# Patient Record
Sex: Female | Born: 1940 | Race: White | Hispanic: No | Marital: Married | Smoking: Never smoker
Health system: Southern US, Community
[De-identification: ages and names within clinical notes are randomized; demographics above are authoritative.]

## PROBLEM LIST (undated history)

## (undated) DIAGNOSIS — E7800 Pure hypercholesterolemia, unspecified: Secondary | ICD-10-CM

## (undated) DIAGNOSIS — C801 Malignant (primary) neoplasm, unspecified: Secondary | ICD-10-CM

## (undated) DIAGNOSIS — M810 Age-related osteoporosis without current pathological fracture: Secondary | ICD-10-CM

## (undated) DIAGNOSIS — H409 Unspecified glaucoma: Secondary | ICD-10-CM

---
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
## 1967-11-17 HISTORY — PX: TUMOR REMOVAL: SHX12

## 1977-11-16 HISTORY — PX: ABDOMINAL HYSTERECTOMY: SHX81

## 2002-11-16 HISTORY — PX: MOHS SURGERY: SUR867

## 2004-09-25 ENCOUNTER — Ambulatory Visit: Payer: Self-pay | Admitting: Internal Medicine

## 2004-11-16 HISTORY — PX: BASAL CELL CARCINOMA EXCISION: SHX1214

## 2005-05-14 ENCOUNTER — Encounter: Admission: RE | Admit: 2005-05-14 | Discharge: 2005-05-14 | Payer: Self-pay | Admitting: Internal Medicine

## 2005-05-26 ENCOUNTER — Encounter: Admission: RE | Admit: 2005-05-26 | Discharge: 2005-05-26 | Payer: Self-pay | Admitting: Internal Medicine

## 2006-05-12 ENCOUNTER — Encounter: Admission: RE | Admit: 2006-05-12 | Discharge: 2006-05-12 | Payer: Self-pay | Admitting: Internal Medicine

## 2007-06-01 ENCOUNTER — Encounter: Admission: RE | Admit: 2007-06-01 | Discharge: 2007-06-01 | Payer: Self-pay | Admitting: Obstetrics and Gynecology

## 2007-10-25 ENCOUNTER — Ambulatory Visit: Payer: Self-pay | Admitting: Internal Medicine

## 2007-11-01 ENCOUNTER — Ambulatory Visit: Payer: Self-pay | Admitting: Internal Medicine

## 2007-11-01 ENCOUNTER — Encounter: Payer: Self-pay | Admitting: Internal Medicine

## 2008-06-15 ENCOUNTER — Encounter: Admission: RE | Admit: 2008-06-15 | Discharge: 2008-06-15 | Payer: Self-pay | Admitting: Obstetrics and Gynecology

## 2008-06-27 ENCOUNTER — Encounter: Admission: RE | Admit: 2008-06-27 | Discharge: 2008-06-27 | Payer: Self-pay | Admitting: Obstetrics and Gynecology

## 2009-06-28 ENCOUNTER — Encounter: Admission: RE | Admit: 2009-06-28 | Discharge: 2009-06-28 | Payer: Self-pay | Admitting: Obstetrics and Gynecology

## 2010-06-30 ENCOUNTER — Encounter: Admission: RE | Admit: 2010-06-30 | Discharge: 2010-06-30 | Payer: Self-pay | Admitting: Internal Medicine

## 2010-11-16 HISTORY — PX: EYE SURGERY: SHX253

## 2010-12-07 ENCOUNTER — Encounter: Payer: Self-pay | Admitting: Internal Medicine

## 2010-12-07 ENCOUNTER — Encounter: Payer: Self-pay | Admitting: Pulmonary Disease

## 2010-12-08 ENCOUNTER — Encounter: Payer: Self-pay | Admitting: Obstetrics and Gynecology

## 2011-06-08 ENCOUNTER — Other Ambulatory Visit: Payer: Self-pay | Admitting: Obstetrics and Gynecology

## 2011-06-08 DIAGNOSIS — Z1231 Encounter for screening mammogram for malignant neoplasm of breast: Secondary | ICD-10-CM

## 2011-07-02 ENCOUNTER — Ambulatory Visit
Admission: RE | Admit: 2011-07-02 | Discharge: 2011-07-02 | Disposition: A | Discharge status: Home / Self Care | Payer: Medicare Other | Source: Ambulatory Visit | Attending: Obstetrics and Gynecology | Admitting: Obstetrics and Gynecology

## 2011-07-02 DIAGNOSIS — Z1231 Encounter for screening mammogram for malignant neoplasm of breast: Secondary | ICD-10-CM

## 2012-05-26 ENCOUNTER — Other Ambulatory Visit: Payer: Self-pay | Admitting: Internal Medicine

## 2012-05-26 DIAGNOSIS — Z1231 Encounter for screening mammogram for malignant neoplasm of breast: Secondary | ICD-10-CM

## 2012-07-04 ENCOUNTER — Ambulatory Visit
Admission: RE | Admit: 2012-07-04 | Discharge: 2012-07-04 | Disposition: A | Discharge status: Home / Self Care | Payer: Medicare Other | Source: Ambulatory Visit | Attending: Internal Medicine | Admitting: Internal Medicine

## 2012-07-04 DIAGNOSIS — Z1231 Encounter for screening mammogram for malignant neoplasm of breast: Secondary | ICD-10-CM

## 2013-05-31 ENCOUNTER — Other Ambulatory Visit: Payer: Self-pay

## 2013-05-31 DIAGNOSIS — Z1231 Encounter for screening mammogram for malignant neoplasm of breast: Secondary | ICD-10-CM

## 2013-07-05 ENCOUNTER — Ambulatory Visit
Admission: RE | Admit: 2013-07-05 | Discharge: 2013-07-05 | Disposition: A | Discharge status: Home / Self Care | Payer: Medicare Other | Source: Ambulatory Visit

## 2013-07-05 DIAGNOSIS — Z1231 Encounter for screening mammogram for malignant neoplasm of breast: Secondary | ICD-10-CM

## 2014-06-01 ENCOUNTER — Other Ambulatory Visit: Payer: Self-pay

## 2014-06-01 DIAGNOSIS — Z1231 Encounter for screening mammogram for malignant neoplasm of breast: Secondary | ICD-10-CM

## 2014-07-06 ENCOUNTER — Ambulatory Visit
Admission: RE | Admit: 2014-07-06 | Discharge: 2014-07-06 | Disposition: A | Discharge status: Home / Self Care | Payer: Medicare Other | Source: Ambulatory Visit

## 2014-07-06 DIAGNOSIS — Z1231 Encounter for screening mammogram for malignant neoplasm of breast: Secondary | ICD-10-CM

## 2014-12-18 ENCOUNTER — Encounter: Payer: Self-pay | Admitting: Internal Medicine

## 2015-01-07 ENCOUNTER — Encounter: Payer: Self-pay | Admitting: Internal Medicine

## 2015-03-15 ENCOUNTER — Encounter (HOSPITAL_COMMUNITY): Payer: Self-pay

## 2015-03-18 ENCOUNTER — Encounter (HOSPITAL_COMMUNITY): Payer: Self-pay

## 2015-03-18 ENCOUNTER — Ambulatory Visit (HOSPITAL_COMMUNITY)
Admission: RE | Admit: 2015-03-18 | Discharge: 2015-03-18 | Disposition: A | Discharge status: Home / Self Care | Payer: Medicare Other | Source: Ambulatory Visit | Attending: Internal Medicine | Admitting: Internal Medicine

## 2015-03-18 ENCOUNTER — Other Ambulatory Visit (HOSPITAL_COMMUNITY): Payer: Self-pay | Admitting: Internal Medicine

## 2015-03-18 DIAGNOSIS — M810 Age-related osteoporosis without current pathological fracture: Secondary | ICD-10-CM | POA: Insufficient documentation

## 2015-03-18 HISTORY — DX: Unspecified glaucoma: H40.9

## 2015-03-18 HISTORY — DX: Malignant (primary) neoplasm, unspecified: C80.1

## 2015-03-18 MED ORDER — DENOSUMAB 60 MG/ML ~~LOC~~ SOLN
60.0000 mg | Freq: Once | SUBCUTANEOUS | Status: AC
  Administered 2015-03-18: 60 mg via SUBCUTANEOUS
  Filled 2015-03-18: qty 1

## 2015-03-18 NOTE — Discharge Instructions (Signed)
Denosumab injection What is this medicine? DENOSUMAB (den oh sue mab) slows bone breakdown. Prolia is used to treat osteoporosis in women after menopause and in men. Xgeva is used to prevent bone fractures and other bone problems caused by cancer bone metastases. Xgeva is also used to treat giant cell tumor of the bone. This medicine may be used for other purposes; ask your health care provider or pharmacist if you have questions. COMMON BRAND NAME(S): Prolia, XGEVA What should I tell my health care provider before I take this medicine? They need to know if you have any of these conditions: -dental disease -eczema -infection or history of infections -kidney disease or on dialysis -low blood calcium or vitamin D -malabsorption syndrome -scheduled to have surgery or tooth extraction -taking medicine that contains denosumab -thyroid or parathyroid disease -an unusual reaction to denosumab, other medicines, foods, dyes, or preservatives -pregnant or trying to get pregnant -breast-feeding How should I use this medicine? This medicine is for injection under the skin. It is given by a health care professional in a hospital or clinic setting. If you are getting Prolia, a special MedGuide will be given to you by the pharmacist with each prescription and refill. Be sure to read this information carefully each time. For Prolia, talk to your pediatrician regarding the use of this medicine in children. Special care may be needed. For Xgeva, talk to your pediatrician regarding the use of this medicine in children. While this drug may be prescribed for children as young as 13 years for selected conditions, precautions do apply. Overdosage: If you think you've taken too much of this medicine contact a poison control center or emergency room at once. Overdosage: If you think you have taken too much of this medicine contact a poison control center or emergency room at once. NOTE: This medicine is only for  you. Do not share this medicine with others. What if I miss a dose? It is important not to miss your dose. Call your doctor or health care professional if you are unable to keep an appointment. What may interact with this medicine? Do not take this medicine with any of the following medications: -other medicines containing denosumab This medicine may also interact with the following medications: -medicines that suppress the immune system -medicines that treat cancer -steroid medicines like prednisone or cortisone This list may not describe all possible interactions. Give your health care provider a list of all the medicines, herbs, non-prescription drugs, or dietary supplements you use. Also tell them if you smoke, drink alcohol, or use illegal drugs. Some items may interact with your medicine. What should I watch for while using this medicine? Visit your doctor or health care professional for regular checks on your progress. Your doctor or health care professional may order blood tests and other tests to see how you are doing. Call your doctor or health care professional if you get a cold or other infection while receiving this medicine. Do not treat yourself. This medicine may decrease your body's ability to fight infection. You should make sure you get enough calcium and vitamin D while you are taking this medicine, unless your doctor tells you not to. Discuss the foods you eat and the vitamins you take with your health care professional. See your dentist regularly. Brush and floss your teeth as directed. Before you have any dental work done, tell your dentist you are receiving this medicine. Do not become pregnant while taking this medicine or for 5 months after stopping   it. Women should inform their doctor if they wish to become pregnant or think they might be pregnant. There is a potential for serious side effects to an unborn child. Talk to your health care professional or pharmacist for more  information. What side effects may I notice from receiving this medicine? Side effects that you should report to your doctor or health care professional as soon as possible: -allergic reactions like skin rash, itching or hives, swelling of the face, lips, or tongue -breathing problems -chest pain -fast, irregular heartbeat -feeling faint or lightheaded, falls -fever, chills, or any other sign of infection -muscle spasms, tightening, or twitches -numbness or tingling -skin blisters or bumps, or is dry, peels, or red -slow healing or unexplained pain in the mouth or jaw -unusual bleeding or bruising Side effects that usually do not require medical attention (Report these to your doctor or health care professional if they continue or are bothersome.): -muscle pain -stomach upset, gas This list may not describe all possible side effects. Call your doctor for medical advice about side effects. You may report side effects to FDA at 1-800-FDA-1088. Where should I keep my medicine? This medicine is only given in a clinic, doctor's office, or other health care setting and will not be stored at home. NOTE: This sheet is a summary. It may not cover all possible information. If you have questions about this medicine, talk to your doctor, pharmacist, or health care provider.  2015, Elsevier/Gold Standard. (2012-05-02 12:37:47)  

## 2015-03-20 ENCOUNTER — Ambulatory Visit (AMBULATORY_SURGERY_CENTER): Payer: Self-pay

## 2015-03-20 VITALS — Ht 64.0 in | Wt 126.8 lb

## 2015-03-20 DIAGNOSIS — Z860100 Personal history of colon polyps, unspecified: Secondary | ICD-10-CM

## 2015-03-20 MED ORDER — MOVIPREP 100 G PO SOLR: 1.0000 | Freq: Once | ORAL | Status: DC

## 2015-03-20 NOTE — Progress Notes (Signed)
No allergies to eggs or soy No diet/weight loss meds No home oxygen No past problems with anesthesia  Has email  Emmi instructions given for colonoscopy 

## 2015-04-03 ENCOUNTER — Other Ambulatory Visit: Payer: Self-pay | Admitting: Internal Medicine

## 2015-04-03 ENCOUNTER — Encounter: Payer: Self-pay | Admitting: Internal Medicine

## 2015-04-03 ENCOUNTER — Ambulatory Visit (AMBULATORY_SURGERY_CENTER): Payer: Medicare Other | Admitting: Internal Medicine

## 2015-04-03 VITALS — BP 110/64 | HR 48 | Temp 95.1°F | Resp 24 | Ht 64.0 in | Wt 126.0 lb

## 2015-04-03 DIAGNOSIS — K635 Polyp of colon: Secondary | ICD-10-CM

## 2015-04-03 DIAGNOSIS — D125 Benign neoplasm of sigmoid colon: Secondary | ICD-10-CM

## 2015-04-03 DIAGNOSIS — Z86010 Personal history of colonic polyps: Secondary | ICD-10-CM

## 2015-04-03 MED ORDER — SODIUM CHLORIDE 0.9 % IV SOLN: 500.0000 mL | INTRAVENOUS | Status: DC

## 2015-04-03 NOTE — Op Note (Signed)
[place]  [address] [place], [id number]   COLONOSCOPY PROCEDURE REPORT  PATIENT: [name], [name]  MR#: [id number] BIRTHDATE: January 22, 1941 , 74  yrs. old GENDER: female ENDOSCOPIST: Lafayette Dragon, MD REFERRED RC:BULAGTX Tisovec, M.D. PROCEDURE DATE:  04/03/2015 PROCEDURE:   Colonoscopy, screening and Colonoscopy with cold biopsy polypectomy First Screening Colonoscopy - Avg.  risk and is 50 yrs.  old or older - No.  Prior Negative Screening - Now for repeat screening. N/A  History of Adenoma - Now for follow-up colonoscopy & has been > or = to 3 yrs.  Yes hx of adenoma.  Has been 3 or more years since last colonoscopy.  Polyps removed today? Yes ASA CLASS:   Class II INDICATIONS:Screening for colonic neoplasia, Colorectal Neoplasm Risk Assessment for this procedure is average risk, and adenomatous polyps removed on colonoscopy in 2003 and 2005.  Last colonoscopy December 2008 showed diverticulosis and hyperplastic polyp. MEDICATIONS: Monitored anesthesia care, Propofol 250 mg IV, and Propofol 200 mg IV  DESCRIPTION OF PROCEDURE:   After the risks benefits and alternatives of the procedure were thoroughly explained, informed consent was obtained.  The digital rectal exam revealed no abnormalities of the rectum.   The LB [id number]-[id number]  endoscope was introduced through the anus and advanced to the cecum, which was identified by both the appendix and ileocecal valve. No adverse events experienced.   The quality of the prep was good.  (MoviPrep was used)  The instrument was then slowly withdrawn as the colon was fully examined. Estimated blood loss is zero unless otherwise noted in this procedure report.      COLON FINDINGS: A sessile polyp measuring 3 mm in size was found in the sigmoid colon.  A polypectomy was performed with cold forceps. The resection was complete, the polyp tissue was completely retrieved and sent to histology.   There was mild  diverticulosis noted in the sigmoid colon with associated muscular hypertrophy. Retroflexed views revealed no abnormalities. The time to cecum = 4.20 Withdrawal time = 8.43   The scope was withdrawn and the procedure completed. COMPLICATIONS: There were no immediate complications.  ENDOSCOPIC IMPRESSION: 1.   Sessile polyp was found in the sigmoid colon; polypectomy was performed with cold forceps 2.   There was mild diverticulosis noted in the sigmoid colon  RECOMMENDATIONS: 1.  Await pathology results 2.  High-fiber diet Recall colonoscopy pending path report  eSigned:  Lafayette Dragon, MD 04/03/2015 9:38 AM   cc:   PATIENT NAME:  [name] [name], [name] [name] MR#: [id number]

## 2015-04-03 NOTE — Progress Notes (Signed)
Called to room to assist during endoscopic procedure.  Patient ID and intended procedure confirmed with present staff. Received instructions for my participation in the procedure from the performing physician.  

## 2015-04-03 NOTE — Patient Instructions (Signed)
YOU HAD AN ENDOSCOPIC PROCEDURE TODAY AT [facility]:   Refer to the procedure report that was given to you for any specific questions about what was found during the examination.  If the procedure report does not answer your questions, please call your gastroenterologist to clarify.  If you requested that your care partner not be given the details of your procedure findings, then the procedure report has been included in a sealed envelope for you to review at your convenience later.  YOU SHOULD EXPECT: Some feelings of bloating in the abdomen. Passage of more gas than usual.  Walking can help get rid of the air that was put into your GI tract during the procedure and reduce the bloating. If you had a lower endoscopy (such as a colonoscopy or flexible sigmoidoscopy) you may notice spotting of blood in your stool or on the toilet paper. If you underwent a bowel prep for your procedure, you may not have a normal bowel movement for a few days.  Please Note:  You might notice some irritation and congestion in your nose or some drainage.  This is from the oxygen used during your procedure.  There is no need for concern and it should clear up in a day or so.  SYMPTOMS TO REPORT IMMEDIATELY:   Following lower endoscopy (colonoscopy or flexible sigmoidoscopy):  Excessive amounts of blood in the stool  Significant tenderness or worsening of abdominal pains  Swelling of the abdomen that is new, acute  Fever of 100F or higher    For urgent or emergent issues, a gastroenterologist can be reached at any hour by calling [phone].   DIET: Your first meal following the procedure should be a small meal and then it is ok to progress to your normal diet. Heavy or fried foods are harder to digest and may make you feel nauseous or bloated.  Likewise, meals heavy in dairy and vegetables can increase bloating.  Drink plenty of fluids but you should avoid alcoholic beverages for 24  hours.  ACTIVITY:  You should plan to take it easy for the rest of today and you should NOT DRIVE or use heavy machinery until tomorrow (because of the sedation medicines used during the test).    FOLLOW UP: Our staff will call the number listed on your records the next business day following your procedure to check on you and address any questions or concerns that you may have regarding the information given to you following your procedure. If we do not reach you, we will leave a message.  However, if you are feeling well and you are not experiencing any problems, there is no need to return our call.  We will assume that you have returned to your regular daily activities without incident.  If any biopsies were taken you will be contacted by phone or by letter within the next 1-3 weeks.  Please call us at [phone] if you have not heard about the biopsies in 3 weeks.    SIGNATURES/CONFIDENTIALITY: You and/or your care partner have signed paperwork which will be entered into your electronic medical record.  These signatures attest to the fact that that the information above on your After Visit Summary has been reviewed and is understood.  Full responsibility of the confidentiality of this discharge information lies with you and/or your care-partner.  Polyp, diverticulosis, high fiber diet information given.

## 2015-04-03 NOTE — Progress Notes (Signed)
Stable to RR 

## 2015-04-04 ENCOUNTER — Telehealth: Payer: Self-pay | Admitting: *Deleted

## 2015-04-04 NOTE — Telephone Encounter (Signed)
  Follow up Call-  Call back number 04/03/2015  Post procedure Call Back phone  # [phone]  Permission to leave phone message Yes     Patient questions:  Do you have a fever, pain , or abdominal swelling? No. Pain Score  0 *  Have you tolerated food without any problems? Yes.    Have you been able to return to your normal activities? Yes.    Do you have any questions about your discharge instructions: Diet   No. Medications  No. Follow up visit  No.  Do you have questions or concerns about your Care? No.  Actions: * If pain score is 4 or above: No action needed, pain <4.

## 2015-04-08 ENCOUNTER — Encounter: Payer: Self-pay | Admitting: Internal Medicine

## 2015-05-13 ENCOUNTER — Other Ambulatory Visit: Payer: Self-pay

## 2015-06-17 ENCOUNTER — Other Ambulatory Visit: Payer: Self-pay

## 2015-06-17 DIAGNOSIS — Z1231 Encounter for screening mammogram for malignant neoplasm of breast: Secondary | ICD-10-CM

## 2015-07-24 ENCOUNTER — Ambulatory Visit
Admission: RE | Admit: 2015-07-24 | Discharge: 2015-07-24 | Disposition: A | Discharge status: Home / Self Care | Payer: Medicare Other | Source: Ambulatory Visit

## 2015-07-24 DIAGNOSIS — Z1231 Encounter for screening mammogram for malignant neoplasm of breast: Secondary | ICD-10-CM

## 2015-09-17 HISTORY — PX: CATARACT EXTRACTION: SUR2

## 2015-09-25 ENCOUNTER — Ambulatory Visit (HOSPITAL_COMMUNITY): Admission: RE | Admit: 2015-09-25 | Payer: Medicare Other | Source: Ambulatory Visit

## 2015-09-25 ENCOUNTER — Other Ambulatory Visit (HOSPITAL_COMMUNITY): Payer: Self-pay | Admitting: Internal Medicine

## 2015-10-03 ENCOUNTER — Ambulatory Visit: Payer: Medicare Other | Admitting: Cardiology

## 2015-12-11 ENCOUNTER — Ambulatory Visit (HOSPITAL_COMMUNITY)
Admission: RE | Admit: 2015-12-11 | Discharge: 2015-12-11 | Disposition: A | Discharge status: Home / Self Care | Payer: Medicare Other | Source: Ambulatory Visit | Attending: Internal Medicine | Admitting: Internal Medicine

## 2015-12-11 ENCOUNTER — Other Ambulatory Visit (HOSPITAL_COMMUNITY): Payer: Self-pay | Admitting: Internal Medicine

## 2015-12-11 ENCOUNTER — Encounter (HOSPITAL_COMMUNITY): Payer: Self-pay

## 2015-12-11 DIAGNOSIS — M810 Age-related osteoporosis without current pathological fracture: Secondary | ICD-10-CM | POA: Diagnosis present

## 2015-12-11 HISTORY — DX: Age-related osteoporosis without current pathological fracture: M81.0

## 2015-12-11 HISTORY — DX: Pure hypercholesterolemia, unspecified: E78.00

## 2015-12-11 MED ORDER — DENOSUMAB 60 MG/ML ~~LOC~~ SOLN
60.0000 mg | Freq: Once | SUBCUTANEOUS | Status: AC
  Administered 2015-12-11: 60 mg via SUBCUTANEOUS
  Filled 2015-12-11: qty 1

## 2015-12-11 NOTE — Discharge Instructions (Signed)
PROLIA °Denosumab injection °What is this medicine? °DENOSUMAB (den oh sue mab) slows bone breakdown. Prolia is used to treat osteoporosis in women after menopause and in men. Xgeva is used to prevent bone fractures and other bone problems caused by cancer bone metastases. Xgeva is also used to treat giant cell tumor of the bone. °This medicine may be used for other purposes; ask your health care provider or pharmacist if you have questions. °What should I tell my health care provider before I take this medicine? °They need to know if you have any of these conditions: °-dental disease °-eczema °-infection or history of infections °-kidney disease or on dialysis °-low blood calcium or vitamin D °-malabsorption syndrome °-scheduled to have surgery or tooth extraction °-taking medicine that contains denosumab °-thyroid or parathyroid disease °-an unusual reaction to denosumab, other medicines, foods, dyes, or preservatives °-pregnant or trying to get pregnant °-breast-feeding °How should I use this medicine? °This medicine is for injection under the skin. It is given by a health care professional in a hospital or clinic setting. °If you are getting Prolia, a special MedGuide will be given to you by the pharmacist with each prescription and refill. Be sure to read this information carefully each time. °For Prolia, talk to your pediatrician regarding the use of this medicine in children. Special care may be needed. For Xgeva, talk to your pediatrician regarding the use of this medicine in children. While this drug may be prescribed for children as young as 13 years for selected conditions, precautions do apply. °Overdosage: If you think you have taken too much of this medicine contact a poison control center or emergency room at once. °NOTE: This medicine is only for you. Do not share this medicine with others. °What if I miss a dose? °It is important not to miss your dose. Call your doctor or health care professional if  you are unable to keep an appointment. °What may interact with this medicine? °Do not take this medicine with any of the following medications: °-other medicines containing denosumab °This medicine may also interact with the following medications: °-medicines that suppress the immune system °-medicines that treat cancer °-steroid medicines like prednisone or cortisone °This list may not describe all possible interactions. Give your health care provider a list of all the medicines, herbs, non-prescription drugs, or dietary supplements you use. Also tell them if you smoke, drink alcohol, or use illegal drugs. Some items may interact with your medicine. °What should I watch for while using this medicine? °Visit your doctor or health care professional for regular checks on your progress. Your doctor or health care professional may order blood tests and other tests to see how you are doing. °Call your doctor or health care professional if you get a cold or other infection while receiving this medicine. Do not treat yourself. This medicine may decrease your body's ability to fight infection. °You should make sure you get enough calcium and vitamin D while you are taking this medicine, unless your doctor tells you not to. Discuss the foods you eat and the vitamins you take with your health care professional. °See your dentist regularly. Brush and floss your teeth as directed. Before you have any dental work done, tell your dentist you are receiving this medicine. °Do not become pregnant while taking this medicine or for 5 months after stopping it. Women should inform their doctor if they wish to become pregnant or think they might be pregnant. There is a potential for serious side   effects to an unborn child. Talk to your health care professional or pharmacist for more information. °What side effects may I notice from receiving this medicine? °Side effects that you should report to your doctor or health care professional as  soon as possible: °-allergic reactions like skin rash, itching or hives, swelling of the face, lips, or tongue °-breathing problems °-chest pain °-fast, irregular heartbeat °-feeling faint or lightheaded, falls °-fever, chills, or any other sign of infection °-muscle spasms, tightening, or twitches °-numbness or tingling °-skin blisters or bumps, or is dry, peels, or red °-slow healing or unexplained pain in the mouth or jaw °-unusual bleeding or bruising °Side effects that usually do not require medical attention (Report these to your doctor or health care professional if they continue or are bothersome.): °-muscle pain °-stomach upset, gas °This list may not describe all possible side effects. Call your doctor for medical advice about side effects. You may report side effects to FDA at 1-800-FDA-1088. °Where should I keep my medicine? °This medicine is only given in a clinic, doctor's office, or other health care setting and will not be stored at home. °NOTE: This sheet is a summary. It may not cover all possible information. If you have questions about this medicine, talk to your doctor, pharmacist, or health care provider. °  °© 2016, Elsevier/Gold Standard. (2012-05-02 12:37:47) °Osteoporosis °Osteoporosis is the thinning and loss of density in the bones. Osteoporosis makes the bones more brittle, fragile, and likely to break (fracture). Over time, osteoporosis can cause the bones to become so weak that they fracture after a simple fall. The bones most likely to fracture are the bones in the hip, wrist, and spine. °CAUSES  °The exact cause is not known. °RISK FACTORS °Anyone can develop osteoporosis. You may be at greater risk if you have a family history of the condition or have poor nutrition. You may also have a higher risk if you are:  °· Female.   °· 50 years old or older. °· A smoker. °· Not physically active.   °· White or Asian. °· Slender. °SIGNS AND SYMPTOMS  °A fracture might be the first sign of the  disease, especially if it results from a fall or injury that would not usually cause a bone to break. Other signs and symptoms include:  °· Low back and neck pain. °· Stooped posture. °· Height loss. °DIAGNOSIS  °To make a diagnosis, your health care provider may: °· Take a medical history. °· Perform a physical exam. °· Order tests, such as: °¨ A bone mineral density test. °¨ A dual-energy X-ray absorptiometry test. °TREATMENT  °The goal of osteoporosis treatment is to strengthen your bones to reduce your risk of a fracture. Treatment may involve: °· Making lifestyle changes, such as: °¨ Eating a diet rich in calcium. °¨ Doing weight-bearing and muscle-strengthening exercises. °¨ Stopping tobacco use. °¨ Limiting alcohol intake. °· Taking medicine to slow the process of bone loss or to increase bone density. °· Monitoring your levels of calcium and vitamin D. °HOME CARE INSTRUCTIONS °· Include calcium and vitamin D in your diet. Calcium is important for bone health, and vitamin D helps the body absorb calcium. °· Perform weight-bearing and muscle-strengthening exercises as directed by your health care provider. °· Do not use any tobacco products, including cigarettes, chewing tobacco, and electronic cigarettes. If you need help quitting, ask your health care provider. °· Limit your alcohol intake. °· Take medicines only as directed by your health care provider. °· Keep all   follow-up visits as directed by your health care provider. This is important. °· Take precautions at home to lower your risk of falling, such as: °¨ Keeping rooms well lit and clutter free. °¨ Installing safety rails on stairs. °¨ Using rubber mats in the bathroom and other areas that are often wet or slippery. °SEEK IMMEDIATE MEDICAL CARE IF:  °You fall or injure yourself.  °  °This information is not intended to replace advice given to you by your health care provider. Make sure you discuss any questions you have with your health care  provider. °  °Document Released: 08/12/2005 Document Revised: 11/23/2014 Document Reviewed: 04/12/2014 °Elsevier Interactive Patient Education ©2016 [employer] ° ° °

## 2016-06-11 ENCOUNTER — Ambulatory Visit (HOSPITAL_COMMUNITY)
Admission: RE | Admit: 2016-06-11 | Discharge: 2016-06-11 | Disposition: A | Discharge status: Home / Self Care | Payer: Medicare Other | Source: Ambulatory Visit | Attending: Internal Medicine | Admitting: Internal Medicine

## 2016-06-11 ENCOUNTER — Encounter (HOSPITAL_COMMUNITY): Payer: Self-pay

## 2016-06-11 DIAGNOSIS — M810 Age-related osteoporosis without current pathological fracture: Secondary | ICD-10-CM | POA: Insufficient documentation

## 2016-06-11 MED ORDER — DENOSUMAB 60 MG/ML ~~LOC~~ SOLN
60.0000 mg | Freq: Once | SUBCUTANEOUS | Status: AC
  Administered 2016-06-11: 60 mg via SUBCUTANEOUS
  Filled 2016-06-11: qty 1

## 2016-06-11 NOTE — Discharge Instructions (Signed)
Denosumab injection  What is this medicine?  DENOSUMAB (den oh sue mab) slows bone breakdown. Prolia is used to treat osteoporosis in women after menopause and in men. Xgeva is used to prevent bone fractures and other bone problems caused by cancer bone metastases. Xgeva is also used to treat giant cell tumor of the bone.  This medicine may be used for other purposes; ask your health care provider or pharmacist if you have questions.  What should I tell my health care provider before I take this medicine?  They need to know if you have any of these conditions:  -dental disease  -eczema  -infection or history of infections  -kidney disease or on dialysis  -low blood calcium or vitamin D  -malabsorption syndrome  -scheduled to have surgery or tooth extraction  -taking medicine that contains denosumab  -thyroid or parathyroid disease  -an unusual reaction to denosumab, other medicines, foods, dyes, or preservatives  -pregnant or trying to get pregnant  -breast-feeding  How should I use this medicine?  This medicine is for injection under the skin. It is given by a health care professional in a hospital or clinic setting.  If you are getting Prolia, a special MedGuide will be given to you by the pharmacist with each prescription and refill. Be sure to read this information carefully each time.  For Prolia, talk to your pediatrician regarding the use of this medicine in children. Special care may be needed. For Xgeva, talk to your pediatrician regarding the use of this medicine in children. While this drug may be prescribed for children as young as 13 years for selected conditions, precautions do apply.  Overdosage: If you think you have taken too much of this medicine contact a poison control center or emergency room at once.  NOTE: This medicine is only for you. Do not share this medicine with others.  What if I miss a dose?  It is important not to miss your dose. Call your doctor or health care professional if you are  unable to keep an appointment.  What may interact with this medicine?  Do not take this medicine with any of the following medications:  -other medicines containing denosumab  This medicine may also interact with the following medications:  -medicines that suppress the immune system  -medicines that treat cancer  -steroid medicines like prednisone or cortisone  This list may not describe all possible interactions. Give your health care provider a list of all the medicines, herbs, non-prescription drugs, or dietary supplements you use. Also tell them if you smoke, drink alcohol, or use illegal drugs. Some items may interact with your medicine.  What should I watch for while using this medicine?  Visit your doctor or health care professional for regular checks on your progress. Your doctor or health care professional may order blood tests and other tests to see how you are doing.  Call your doctor or health care professional if you get a cold or other infection while receiving this medicine. Do not treat yourself. This medicine may decrease your body's ability to fight infection.  You should make sure you get enough calcium and vitamin D while you are taking this medicine, unless your doctor tells you not to. Discuss the foods you eat and the vitamins you take with your health care professional.  See your dentist regularly. Brush and floss your teeth as directed. Before you have any dental work done, tell your dentist you are receiving this medicine.  Do   not become pregnant while taking this medicine or for 5 months after stopping it. Women should inform their doctor if they wish to become pregnant or think they might be pregnant. There is a potential for serious side effects to an unborn child. Talk to your health care professional or pharmacist for more information.  What side effects may I notice from receiving this medicine?  Side effects that you should report to your doctor or health care professional as soon as  possible:  -allergic reactions like skin rash, itching or hives, swelling of the face, lips, or tongue  -breathing problems  -chest pain  -fast, irregular heartbeat  -feeling faint or lightheaded, falls  -fever, chills, or any other sign of infection  -muscle spasms, tightening, or twitches  -numbness or tingling  -skin blisters or bumps, or is dry, peels, or red  -slow healing or unexplained pain in the mouth or jaw  -unusual bleeding or bruising  Side effects that usually do not require medical attention (Report these to your doctor or health care professional if they continue or are bothersome.):  -muscle pain  -stomach upset, gas  This list may not describe all possible side effects. Call your doctor for medical advice about side effects. You may report side effects to FDA at 1-800-FDA-1088.  Where should I keep my medicine?  This medicine is only given in a clinic, doctor's office, or other health care setting and will not be stored at home.  NOTE: This sheet is a summary. It may not cover all possible information. If you have questions about this medicine, talk to your doctor, pharmacist, or health care provider.      2016, Elsevier/Gold Standard. (2012-05-02 12:37:47)

## 2016-06-11 NOTE — Progress Notes (Signed)
Prolia given, d/c instructions also given to pt.  Next appointment for prolia given to pt also.  Pt d/c ambulatory to lobby with husband.

## 2016-06-22 ENCOUNTER — Other Ambulatory Visit: Payer: Self-pay | Admitting: Internal Medicine

## 2016-06-22 DIAGNOSIS — Z1231 Encounter for screening mammogram for malignant neoplasm of breast: Secondary | ICD-10-CM

## 2016-07-29 ENCOUNTER — Ambulatory Visit
Admission: RE | Admit: 2016-07-29 | Discharge: 2016-07-29 | Disposition: A | Discharge status: Home / Self Care | Payer: Medicare Other | Source: Ambulatory Visit | Attending: Internal Medicine | Admitting: Internal Medicine

## 2016-07-29 DIAGNOSIS — Z1231 Encounter for screening mammogram for malignant neoplasm of breast: Secondary | ICD-10-CM

## 2016-12-14 ENCOUNTER — Ambulatory Visit (HOSPITAL_COMMUNITY)
Admission: RE | Admit: 2016-12-14 | Discharge: 2016-12-14 | Disposition: A | Discharge status: Home / Self Care | Payer: Medicare Other | Source: Ambulatory Visit | Attending: Internal Medicine | Admitting: Internal Medicine

## 2016-12-14 ENCOUNTER — Encounter (HOSPITAL_COMMUNITY): Payer: Self-pay

## 2016-12-14 DIAGNOSIS — M810 Age-related osteoporosis without current pathological fracture: Secondary | ICD-10-CM | POA: Insufficient documentation

## 2016-12-14 MED ORDER — DENOSUMAB 60 MG/ML ~~LOC~~ SOLN
60.0000 mg | Freq: Once | SUBCUTANEOUS | Status: AC
  Administered 2016-12-14: 60 mg via SUBCUTANEOUS
  Filled 2016-12-14: qty 1

## 2016-12-14 NOTE — Discharge Instructions (Signed)
PROLIA °Denosumab injection °What is this medicine? °DENOSUMAB (den oh sue mab) slows bone breakdown. Prolia is used to treat osteoporosis in women after menopause and in men. Xgeva is used to prevent bone fractures and other bone problems caused by cancer bone metastases. Xgeva is also used to treat giant cell tumor of the bone. °COMMON BRAND NAME(S): Prolia, XGEVA °What should I tell my health care provider before I take this medicine? °They need to know if you have any of these conditions: °-dental disease °-eczema °-infection or history of infections °-kidney disease or on dialysis °-low blood calcium or vitamin D °-malabsorption syndrome °-scheduled to have surgery or tooth extraction °-taking medicine that contains denosumab °-thyroid or parathyroid disease °-an unusual reaction to denosumab, other medicines, foods, dyes, or preservatives °-pregnant or trying to get pregnant °-breast-feeding °How should I use this medicine? °This medicine is for injection under the skin. It is given by a health care professional in a hospital or clinic setting. °If you are getting Prolia, a special MedGuide will be given to you by the pharmacist with each prescription and refill. Be sure to read this information carefully each time. °For Prolia, talk to your pediatrician regarding the use of this medicine in children. Special care may be needed. For Xgeva, talk to your pediatrician regarding the use of this medicine in children. While this drug may be prescribed for children as young as 13 years for selected conditions, precautions do apply. °What if I miss a dose? °It is important not to miss your dose. Call your doctor or health care professional if you are unable to keep an appointment. °What may interact with this medicine? °Do not take this medicine with any of the following medications: °-other medicines containing denosumab °This medicine may also interact with the following medications: °-medicines that suppress the  immune system °-medicines that treat cancer °-steroid medicines like prednisone or cortisone °What should I watch for while using this medicine? °Visit your doctor or health care professional for regular checks on your progress. Your doctor or health care professional may order blood tests and other tests to see how you are doing. °Call your doctor or health care professional if you get a cold or other infection while receiving this medicine. Do not treat yourself. This medicine may decrease your body's ability to fight infection. °You should make sure you get enough calcium and vitamin D while you are taking this medicine, unless your doctor tells you not to. Discuss the foods you eat and the vitamins you take with your health care professional. °See your dentist regularly. Brush and floss your teeth as directed. Before you have any dental work done, tell your dentist you are receiving this medicine. °Do not become pregnant while taking this medicine or for 5 months after stopping it. Women should inform their doctor if they wish to become pregnant or think they might be pregnant. There is a potential for serious side effects to an unborn child. Talk to your health care professional or pharmacist for more information. °What side effects may I notice from receiving this medicine? °Side effects that you should report to your doctor or health care professional as soon as possible: °-allergic reactions like skin rash, itching or hives, swelling of the face, lips, or tongue °-breathing problems °-chest pain °-fast, irregular heartbeat °-feeling faint or lightheaded, falls °-fever, chills, or any other sign of infection °-muscle spasms, tightening, or twitches °-numbness or tingling °-skin blisters or bumps, or is dry, peels, or red °-  slow healing or unexplained pain in the mouth or jaw °-unusual bleeding or bruising °Side effects that usually do not require medical attention (report to your doctor or health care  professional if they continue or are bothersome): °-muscle pain °-stomach upset, gas °Where should I keep my medicine? °This medicine is only given in a clinic, doctor's office, or other health care setting and will not be stored at home. °© 2017 Elsevier/Gold Standard (2015-12-05 10:06:55) ° °Osteoporosis °Osteoporosis is the thinning and loss of density in the bones. Osteoporosis makes the bones more brittle, fragile, and likely to break (fracture). Over time, osteoporosis can cause the bones to become so weak that they fracture after a simple fall. The bones most likely to fracture are the bones in the hip, wrist, and spine. °What are the causes? °The exact cause is not known. °What increases the risk? °Anyone can develop osteoporosis. You may be at greater risk if you have a family history of the condition or have poor nutrition. You may also have a higher risk if you are: °· Female. °· 50 years old or older. °· A smoker. °· Not physically active. °· White or Asian. °· Slender. °What are the signs or symptoms? °A fracture might be the first sign of the disease, especially if it results from a fall or injury that would not usually cause a bone to break. Other signs and symptoms include: °· Low back and neck pain. °· Stooped posture. °· Height loss. °How is this diagnosed? °To make a diagnosis, your health care provider may: °· Take a medical history. °· Perform a physical exam. °· Order tests, such as: °¨ A bone mineral density test. °¨ A dual-energy X-ray absorptiometry test. °How is this treated? °The goal of osteoporosis treatment is to strengthen your bones to reduce your risk of a fracture. Treatment may involve: °· Making lifestyle changes, such as: °¨ Eating a diet rich in calcium. °¨ Doing weight-bearing and muscle-strengthening exercises. °¨ Stopping tobacco use. °¨ Limiting alcohol intake. °· Taking medicine to slow the process of bone loss or to increase bone density. °· Monitoring your levels of  calcium and vitamin D. °Follow these instructions at home: °· Include calcium and vitamin D in your diet. Calcium is important for bone health, and vitamin D helps the body absorb calcium. °· Perform weight-bearing and muscle-strengthening exercises as directed by your health care provider. °· Do not use any tobacco products, including cigarettes, chewing tobacco, and electronic cigarettes. If you need help quitting, ask your health care provider. °· Limit your alcohol intake. °· Take medicines only as directed by your health care provider. °· Keep all follow-up visits as directed by your health care provider. This is important. °· Take precautions at home to lower your risk of falling, such as: °¨ Keeping rooms well lit and clutter free. °¨ Installing safety rails on stairs. °¨ Using rubber mats in the bathroom and other areas that are often wet or slippery. °Get help right away if: °You fall or injure yourself. °This information is not intended to replace advice given to you by your health care provider. Make sure you discuss any questions you have with your health care provider. °Document Released: 08/12/2005 Document Revised: 04/06/2016 Document Reviewed: 04/12/2014 °Elsevier Interactive Patient Education © 2017 [employer]. ° ° °

## 2017-06-14 ENCOUNTER — Ambulatory Visit (HOSPITAL_COMMUNITY)
Admission: RE | Admit: 2017-06-14 | Discharge: 2017-06-14 | Disposition: A | Discharge status: Home / Self Care | Payer: Medicare Other | Source: Ambulatory Visit | Attending: Internal Medicine | Admitting: Internal Medicine

## 2017-06-14 ENCOUNTER — Encounter (HOSPITAL_COMMUNITY): Payer: Self-pay

## 2017-06-14 DIAGNOSIS — M810 Age-related osteoporosis without current pathological fracture: Secondary | ICD-10-CM | POA: Diagnosis not present

## 2017-06-14 MED ORDER — DENOSUMAB 60 MG/ML ~~LOC~~ SOLN
60.0000 mg | Freq: Once | SUBCUTANEOUS | Status: AC
  Administered 2017-06-14: 60 mg via SUBCUTANEOUS
  Filled 2017-06-14: qty 1

## 2017-06-14 NOTE — Discharge Instructions (Signed)
Denosumab injection / Prolia What is this medicine? DENOSUMAB (den oh sue mab) slows bone breakdown. Prolia is used to treat osteoporosis in women after menopause and in men. Delton See is used to treat a high calcium level due to cancer and to prevent bone fractures and other bone problems caused by multiple myeloma or cancer bone metastases. Delton See is also used to treat giant cell tumor of the bone. This medicine may be used for other purposes; ask your health care provider or pharmacist if you have questions. COMMON BRAND NAME(S): Prolia, XGEVA What should I tell my health care provider before I take this medicine? They need to know if you have any of these conditions: -dental disease -having surgery or tooth extraction -infection -kidney disease -low levels of calcium or Vitamin D in the blood -malnutrition -on hemodialysis -skin conditions or sensitivity -thyroid or parathyroid disease -an unusual reaction to denosumab, other medicines, foods, dyes, or preservatives -pregnant or trying to get pregnant -breast-feeding How should I use this medicine? This medicine is for injection under the skin. It is given by a health care professional in a hospital or clinic setting. If you are getting Prolia, a special MedGuide will be given to you by the pharmacist with each prescription and refill. Be sure to read this information carefully each time. For Prolia, talk to your pediatrician regarding the use of this medicine in children. Special care may be needed. For Delton See, talk to your pediatrician regarding the use of this medicine in children. While this drug may be prescribed for children as young as 13 years for selected conditions, precautions do apply. Overdosage: If you think you have taken too much of this medicine contact a poison control center or emergency room at once. NOTE: This medicine is only for you. Do not share this medicine with others. What if I miss a dose? It is important not to  miss your dose. Call your doctor or health care professional if you are unable to keep an appointment. What may interact with this medicine? Do not take this medicine with any of the following medications: -other medicines containing denosumab This medicine may also interact with the following medications: -medicines that lower your chance of fighting infection -steroid medicines like prednisone or cortisone This list may not describe all possible interactions. Give your health care provider a list of all the medicines, herbs, non-prescription drugs, or dietary supplements you use. Also tell them if you smoke, drink alcohol, or use illegal drugs. Some items may interact with your medicine. What should I watch for while using this medicine? Visit your doctor or health care professional for regular checks on your progress. Your doctor or health care professional may order blood tests and other tests to see how you are doing. Call your doctor or health care professional for advice if you get a fever, chills or sore throat, or other symptoms of a cold or flu. Do not treat yourself. This drug may decrease your body's ability to fight infection. Try to avoid being around people who are sick. You should make sure you get enough calcium and vitamin D while you are taking this medicine, unless your doctor tells you not to. Discuss the foods you eat and the vitamins you take with your health care professional. See your dentist regularly. Brush and floss your teeth as directed. Before you have any dental work done, tell your dentist you are receiving this medicine. Do not become pregnant while taking this medicine or for 5 months  after stopping it. Talk with your doctor or health care professional about your birth control options while taking this medicine. Women should inform their doctor if they wish to become pregnant or think they might be pregnant. There is a potential for serious side effects to an unborn  child. Talk to your health care professional or pharmacist for more information. What side effects may I notice from receiving this medicine? Side effects that you should report to your doctor or health care professional as soon as possible: -allergic reactions like skin rash, itching or hives, swelling of the face, lips, or tongue -bone pain -breathing problems -dizziness -jaw pain, especially after dental work -redness, blistering, peeling of the skin -signs and symptoms of infection like fever or chills; cough; sore throat; pain or trouble passing urine -signs of low calcium like fast heartbeat, muscle cramps or muscle pain; pain, tingling, numbness in the hands or feet; seizures -unusual bleeding or bruising -unusually weak or tired Side effects that usually do not require medical attention (report to your doctor or health care professional if they continue or are bothersome): -constipation -diarrhea -headache -joint pain -loss of appetite -muscle pain -runny nose -tiredness -upset stomach This list may not describe all possible side effects. Call your doctor for medical advice about side effects. You may report side effects to FDA at 1-800-FDA-1088. Where should I keep my medicine? This medicine is only given in a clinic, doctor's office, or other health care setting and will not be stored at home. NOTE: This sheet is a summary. It may not cover all possible information. If you have questions about this medicine, talk to your doctor, pharmacist, or health care provider.  2018 Elsevier/Gold Standard (2016-11-24 19:17:21)

## 2017-06-15 ENCOUNTER — Other Ambulatory Visit: Payer: Self-pay | Admitting: Internal Medicine

## 2017-06-15 DIAGNOSIS — Z1231 Encounter for screening mammogram for malignant neoplasm of breast: Secondary | ICD-10-CM

## 2017-07-30 ENCOUNTER — Ambulatory Visit
Admission: RE | Admit: 2017-07-30 | Discharge: 2017-07-30 | Disposition: A | Discharge status: Home / Self Care | Payer: Medicare Other | Source: Ambulatory Visit | Attending: Internal Medicine | Admitting: Internal Medicine

## 2017-07-30 DIAGNOSIS — Z1231 Encounter for screening mammogram for malignant neoplasm of breast: Secondary | ICD-10-CM

## 2017-12-16 ENCOUNTER — Encounter (HOSPITAL_COMMUNITY): Payer: Self-pay

## 2017-12-16 ENCOUNTER — Ambulatory Visit (HOSPITAL_COMMUNITY)
Admission: RE | Admit: 2017-12-16 | Discharge: 2017-12-16 | Disposition: A | Discharge status: Home / Self Care | Payer: Medicare Other | Source: Ambulatory Visit | Attending: Internal Medicine | Admitting: Internal Medicine

## 2017-12-16 DIAGNOSIS — M810 Age-related osteoporosis without current pathological fracture: Secondary | ICD-10-CM | POA: Insufficient documentation

## 2017-12-16 MED ORDER — DENOSUMAB 60 MG/ML ~~LOC~~ SOLN
60.0000 mg | Freq: Once | SUBCUTANEOUS | Status: AC
  Administered 2017-12-16: 60 mg via SUBCUTANEOUS
  Filled 2017-12-16: qty 1

## 2017-12-16 NOTE — Discharge Instructions (Signed)
Prolia Denosumab injection What is this medicine? DENOSUMAB (den oh sue mab) slows bone breakdown. Prolia is used to treat osteoporosis in women after menopause and in men. Delton See is used to treat a high calcium level due to cancer and to prevent bone fractures and other bone problems caused by multiple myeloma or cancer bone metastases. Delton See is also used to treat giant cell tumor of the bone. This medicine may be used for other purposes; ask your health care provider or pharmacist if you have questions. COMMON BRAND NAME(S): Prolia, XGEVA What should I tell my health care provider before I take this medicine? They need to know if you have any of these conditions: -dental disease -having surgery or tooth extraction -infection -kidney disease -low levels of calcium or Vitamin D in the blood -malnutrition -on hemodialysis -skin conditions or sensitivity -thyroid or parathyroid disease -an unusual reaction to denosumab, other medicines, foods, dyes, or preservatives -pregnant or trying to get pregnant -breast-feeding How should I use this medicine? This medicine is for injection under the skin. It is given by a health care professional in a hospital or clinic setting. If you are getting Prolia, a special MedGuide will be given to you by the pharmacist with each prescription and refill. Be sure to read this information carefully each time. For Prolia, talk to your pediatrician regarding the use of this medicine in children. Special care may be needed. For Delton See, talk to your pediatrician regarding the use of this medicine in children. While this drug may be prescribed for children as young as 13 years for selected conditions, precautions do apply. Overdosage: If you think you have taken too much of this medicine contact a poison control center or emergency room at once. NOTE: This medicine is only for you. Do not share this medicine with others. What if I miss a dose? It is important not to  miss your dose. Call your doctor or health care professional if you are unable to keep an appointment. What may interact with this medicine? Do not take this medicine with any of the following medications: -other medicines containing denosumab This medicine may also interact with the following medications: -medicines that lower your chance of fighting infection -steroid medicines like prednisone or cortisone This list may not describe all possible interactions. Give your health care provider a list of all the medicines, herbs, non-prescription drugs, or dietary supplements you use. Also tell them if you smoke, drink alcohol, or use illegal drugs. Some items may interact with your medicine. What should I watch for while using this medicine? Visit your doctor or health care professional for regular checks on your progress. Your doctor or health care professional may order blood tests and other tests to see how you are doing. Call your doctor or health care professional for advice if you get a fever, chills or sore throat, or other symptoms of a cold or flu. Do not treat yourself. This drug may decrease your body's ability to fight infection. Try to avoid being around people who are sick. You should make sure you get enough calcium and vitamin D while you are taking this medicine, unless your doctor tells you not to. Discuss the foods you eat and the vitamins you take with your health care professional. See your dentist regularly. Brush and floss your teeth as directed. Before you have any dental work done, tell your dentist you are receiving this medicine. Do not become pregnant while taking this medicine or for 5 months after  stopping it. Talk with your doctor or health care professional about your birth control options while taking this medicine. Women should inform their doctor if they wish to become pregnant or think they might be pregnant. There is a potential for serious side effects to an unborn  child. Talk to your health care professional or pharmacist for more information. What side effects may I notice from receiving this medicine? Side effects that you should report to your doctor or health care professional as soon as possible: -allergic reactions like skin rash, itching or hives, swelling of the face, lips, or tongue -bone pain -breathing problems -dizziness -jaw pain, especially after dental work -redness, blistering, peeling of the skin -signs and symptoms of infection like fever or chills; cough; sore throat; pain or trouble passing urine -signs of low calcium like fast heartbeat, muscle cramps or muscle pain; pain, tingling, numbness in the hands or feet; seizures -unusual bleeding or bruising -unusually weak or tired Side effects that usually do not require medical attention (report to your doctor or health care professional if they continue or are bothersome): -constipation -diarrhea -headache -joint pain -loss of appetite -muscle pain -runny nose -tiredness -upset stomach This list may not describe all possible side effects. Call your doctor for medical advice about side effects. You may report side effects to FDA at 1-800-FDA-1088. Where should I keep my medicine? This medicine is only given in a clinic, doctor's office, or other health care setting and will not be stored at home. NOTE: This sheet is a summary. It may not cover all possible information. If you have questions about this medicine, talk to your doctor, pharmacist, or health care provider.  2018 [employer]/Gold Standard (2016-11-24 19:17:21)

## 2018-06-16 ENCOUNTER — Encounter (HOSPITAL_COMMUNITY): Payer: Self-pay

## 2018-06-16 ENCOUNTER — Ambulatory Visit (HOSPITAL_COMMUNITY)
Admission: RE | Admit: 2018-06-16 | Discharge: 2018-06-16 | Disposition: A | Discharge status: Home / Self Care | Payer: Medicare Other | Source: Ambulatory Visit | Attending: Internal Medicine | Admitting: Internal Medicine

## 2018-06-16 DIAGNOSIS — M810 Age-related osteoporosis without current pathological fracture: Secondary | ICD-10-CM | POA: Insufficient documentation

## 2018-06-16 MED ORDER — DENOSUMAB 60 MG/ML ~~LOC~~ SOSY
60.0000 mg | PREFILLED_SYRINGE | Freq: Once | SUBCUTANEOUS | Status: AC
  Administered 2018-06-16: 60 mg via SUBCUTANEOUS
  Filled 2018-06-16: qty 1

## 2018-06-16 NOTE — Discharge Instructions (Signed)
Denosumab injection °What is this medicine? °DENOSUMAB (den oh sue mab) slows bone breakdown. Prolia is used to treat osteoporosis in women after menopause and in men. Xgeva is used to treat a high calcium level due to cancer and to prevent bone fractures and other bone problems caused by multiple myeloma or cancer bone metastases. Xgeva is also used to treat giant cell tumor of the bone. °This medicine may be used for other purposes; ask your health care provider or pharmacist if you have questions. °COMMON BRAND NAME(S): Prolia, XGEVA °What should I tell my health care provider before I take this medicine? °They need to know if you have any of these conditions: °-dental disease °-having surgery or tooth extraction °-infection °-kidney disease °-low levels of calcium or Vitamin D in the blood °-malnutrition °-on hemodialysis °-skin conditions or sensitivity °-thyroid or parathyroid disease °-an unusual reaction to denosumab, other medicines, foods, dyes, or preservatives °-pregnant or trying to get pregnant °-breast-feeding °How should I use this medicine? °This medicine is for injection under the skin. It is given by a health care professional in a hospital or clinic setting. °If you are getting Prolia, a special MedGuide will be given to you by the pharmacist with each prescription and refill. Be sure to read this information carefully each time. °For Prolia, talk to your pediatrician regarding the use of this medicine in children. Special care may be needed. For Xgeva, talk to your pediatrician regarding the use of this medicine in children. While this drug may be prescribed for children as young as 13 years for selected conditions, precautions do apply. °Overdosage: If you think you have taken too much of this medicine contact a poison control center or emergency room at once. °NOTE: This medicine is only for you. Do not share this medicine with others. °What if I miss a dose? °It is important not to miss your  dose. Call your doctor or health care professional if you are unable to keep an appointment. °What may interact with this medicine? °Do not take this medicine with any of the following medications: °-other medicines containing denosumab °This medicine may also interact with the following medications: °-medicines that lower your chance of fighting infection °-steroid medicines like prednisone or cortisone °This list may not describe all possible interactions. Give your health care provider a list of all the medicines, herbs, non-prescription drugs, or dietary supplements you use. Also tell them if you smoke, drink alcohol, or use illegal drugs. Some items may interact with your medicine. °What should I watch for while using this medicine? °Visit your doctor or health care professional for regular checks on your progress. Your doctor or health care professional may order blood tests and other tests to see how you are doing. °Call your doctor or health care professional for advice if you get a fever, chills or sore throat, or other symptoms of a cold or flu. Do not treat yourself. This drug may decrease your body's ability to fight infection. Try to avoid being around people who are sick. °You should make sure you get enough calcium and vitamin D while you are taking this medicine, unless your doctor tells you not to. Discuss the foods you eat and the vitamins you take with your health care professional. °See your dentist regularly. Brush and floss your teeth as directed. Before you have any dental work done, tell your dentist you are receiving this medicine. °Do not become pregnant while taking this medicine or for 5 months after stopping   it. Talk with your doctor or health care professional about your birth control options while taking this medicine. Women should inform their doctor if they wish to become pregnant or think they might be pregnant. There is a potential for serious side effects to an unborn child. Talk  to your health care professional or pharmacist for more information. What side effects may I notice from receiving this medicine? Side effects that you should report to your doctor or health care professional as soon as possible: -allergic reactions like skin rash, itching or hives, swelling of the face, lips, or tongue -bone pain -breathing problems -dizziness -jaw pain, especially after dental work -redness, blistering, peeling of the skin -signs and symptoms of infection like fever or chills; cough; sore throat; pain or trouble passing urine -signs of low calcium like fast heartbeat, muscle cramps or muscle pain; pain, tingling, numbness in the hands or feet; seizures -unusual bleeding or bruising -unusually weak or tired Side effects that usually do not require medical attention (report to your doctor or health care professional if they continue or are bothersome): -constipation -diarrhea -headache -joint pain -loss of appetite -muscle pain -runny nose -tiredness -upset stomach This list may not describe all possible side effects. Call your doctor for medical advice about side effects. You may report side effects to FDA at 1-800-FDA-1088. Where should I keep my medicine? This medicine is only given in a clinic, doctor's office, or other health care setting and will not be stored at home. NOTE: This sheet is a summary. It may not cover all possible information. If you have questions about this medicine, talk to your doctor, pharmacist, or health care provider.  2018 [employer] (2016-11-24 19:17:21)

## 2018-06-28 ENCOUNTER — Other Ambulatory Visit: Payer: Self-pay | Admitting: Internal Medicine

## 2018-06-28 DIAGNOSIS — Z1231 Encounter for screening mammogram for malignant neoplasm of breast: Secondary | ICD-10-CM

## 2018-08-01 ENCOUNTER — Ambulatory Visit
Admission: RE | Admit: 2018-08-01 | Discharge: 2018-08-01 | Disposition: A | Discharge status: Home / Self Care | Payer: Medicare Other | Source: Ambulatory Visit | Attending: Internal Medicine | Admitting: Internal Medicine

## 2018-08-01 DIAGNOSIS — Z1231 Encounter for screening mammogram for malignant neoplasm of breast: Secondary | ICD-10-CM

## 2019-01-12 ENCOUNTER — Ambulatory Visit (HOSPITAL_COMMUNITY)
Admission: RE | Admit: 2019-01-12 | Discharge: 2019-01-12 | Disposition: A | Discharge status: Home / Self Care | Payer: Medicare Other | Source: Ambulatory Visit | Attending: Internal Medicine | Admitting: Internal Medicine

## 2019-01-12 ENCOUNTER — Encounter (HOSPITAL_COMMUNITY): Payer: Self-pay

## 2019-01-12 DIAGNOSIS — M810 Age-related osteoporosis without current pathological fracture: Secondary | ICD-10-CM | POA: Insufficient documentation

## 2019-01-12 MED ORDER — DENOSUMAB 60 MG/ML ~~LOC~~ SOSY
60.0000 mg | PREFILLED_SYRINGE | Freq: Once | SUBCUTANEOUS | Status: AC
  Administered 2019-01-12: 60 mg via SUBCUTANEOUS
  Filled 2019-01-12: qty 1

## 2019-06-23 ENCOUNTER — Other Ambulatory Visit: Payer: Self-pay | Admitting: Internal Medicine

## 2019-06-23 DIAGNOSIS — Z1231 Encounter for screening mammogram for malignant neoplasm of breast: Secondary | ICD-10-CM

## 2019-07-14 ENCOUNTER — Ambulatory Visit (HOSPITAL_COMMUNITY)
Admission: RE | Admit: 2019-07-14 | Discharge: 2019-07-14 | Disposition: A | Discharge status: Home / Self Care | Payer: Medicare Other | Source: Ambulatory Visit | Attending: Internal Medicine | Admitting: Internal Medicine

## 2019-07-14 ENCOUNTER — Encounter (HOSPITAL_COMMUNITY): Payer: Self-pay

## 2019-07-14 ENCOUNTER — Other Ambulatory Visit: Payer: Self-pay

## 2019-07-14 DIAGNOSIS — M810 Age-related osteoporosis without current pathological fracture: Secondary | ICD-10-CM | POA: Diagnosis not present

## 2019-07-14 MED ORDER — DENOSUMAB 60 MG/ML ~~LOC~~ SOSY
60.0000 mg | PREFILLED_SYRINGE | Freq: Once | SUBCUTANEOUS | Status: AC
  Administered 2019-07-14: 11:00:00 60 mg via SUBCUTANEOUS
  Filled 2019-07-14: qty 1

## 2019-07-14 NOTE — Discharge Instructions (Signed)
Denosumab injection What is this medicine? DENOSUMAB (den oh sue mab) slows bone breakdown. Prolia is used to treat osteoporosis in women after menopause and in men, and in people who are taking corticosteroids for 6 months or more. Delton See is used to treat a high calcium level due to cancer and to prevent bone fractures and other bone problems caused by multiple myeloma or cancer bone metastases. Delton See is also used to treat giant cell tumor of the bone. This medicine may be used for other purposes; ask your health care provider or pharmacist if you have questions. COMMON BRAND NAME(S): Prolia, XGEVA What should I tell my health care provider before I take this medicine? They need to know if you have any of these conditions:  dental disease  having surgery or tooth extraction  infection  kidney disease  low levels of calcium or Vitamin D in the blood  malnutrition  on hemodialysis  skin conditions or sensitivity  thyroid or parathyroid disease  an unusual reaction to denosumab, other medicines, foods, dyes, or preservatives  pregnant or trying to get pregnant  breast-feeding How should I use this medicine? This medicine is for injection under the skin. It is given by a health care professional in a hospital or clinic setting. A special MedGuide will be given to you before each treatment. Be sure to read this information carefully each time. For Prolia, talk to your pediatrician regarding the use of this medicine in children. Special care may be needed. For Delton See, talk to your pediatrician regarding the use of this medicine in children. While this drug may be prescribed for children as young as 13 years for selected conditions, precautions do apply. Overdosage: If you think you have taken too much of this medicine contact a poison control center or emergency room at once. NOTE: This medicine is only for you. Do not share this medicine with others. What if I miss a dose? It is  important not to miss your dose. Call your doctor or health care professional if you are unable to keep an appointment. What may interact with this medicine? Do not take this medicine with any of the following medications:  other medicines containing denosumab This medicine may also interact with the following medications:  medicines that lower your chance of fighting infection  steroid medicines like prednisone or cortisone This list may not describe all possible interactions. Give your health care provider a list of all the medicines, herbs, non-prescription drugs, or dietary supplements you use. Also tell them if you smoke, drink alcohol, or use illegal drugs. Some items may interact with your medicine. What should I watch for while using this medicine? Visit your doctor or health care professional for regular checks on your progress. Your doctor or health care professional may order blood tests and other tests to see how you are doing. Call your doctor or health care professional for advice if you get a fever, chills or sore throat, or other symptoms of a cold or flu. Do not treat yourself. This drug may decrease your body's ability to fight infection. Try to avoid being around people who are sick. You should make sure you get enough calcium and vitamin D while you are taking this medicine, unless your doctor tells you not to. Discuss the foods you eat and the vitamins you take with your health care professional. See your dentist regularly. Brush and floss your teeth as directed. Before you have any dental work done, tell your dentist you are  receiving this medicine. Do not become pregnant while taking this medicine or for 5 months after stopping it. Talk with your doctor or health care professional about your birth control options while taking this medicine. Women should inform their doctor if they wish to become pregnant or think they might be pregnant. There is a potential for serious side  effects to an unborn child. Talk to your health care professional or pharmacist for more information. What side effects may I notice from receiving this medicine? Side effects that you should report to your doctor or health care professional as soon as possible:  allergic reactions like skin rash, itching or hives, swelling of the face, lips, or tongue  bone pain  breathing problems  dizziness  jaw pain, especially after dental work  redness, blistering, peeling of the skin  signs and symptoms of infection like fever or chills; cough; sore throat; pain or trouble passing urine  signs of low calcium like fast heartbeat, muscle cramps or muscle pain; pain, tingling, numbness in the hands or feet; seizures  unusual bleeding or bruising  unusually weak or tired Side effects that usually do not require medical attention (report to your doctor or health care professional if they continue or are bothersome):  constipation  diarrhea  headache  joint pain  loss of appetite  muscle pain  runny nose  tiredness  upset stomach This list may not describe all possible side effects. Call your doctor for medical advice about side effects. You may report side effects to FDA at 1-800-FDA-1088. Where should I keep my medicine? This medicine is only given in a clinic, doctor's office, or other health care setting and will not be stored at home. NOTE: This sheet is a summary. It may not cover all possible information. If you have questions about this medicine, talk to your doctor, pharmacist, or health care provider.  2020 [employer] (2018-03-11 16:10:44)

## 2019-08-09 ENCOUNTER — Ambulatory Visit
Admission: RE | Admit: 2019-08-09 | Discharge: 2019-08-09 | Disposition: A | Discharge status: Home / Self Care | Payer: Medicare Other | Source: Ambulatory Visit | Attending: Internal Medicine | Admitting: Internal Medicine

## 2019-08-09 ENCOUNTER — Other Ambulatory Visit: Payer: Self-pay

## 2019-08-09 DIAGNOSIS — Z1231 Encounter for screening mammogram for malignant neoplasm of breast: Secondary | ICD-10-CM

## 2020-01-15 ENCOUNTER — Ambulatory Visit (HOSPITAL_COMMUNITY): Payer: Medicare Other

## 2020-01-30 ENCOUNTER — Other Ambulatory Visit: Payer: Self-pay

## 2020-01-30 ENCOUNTER — Encounter (HOSPITAL_COMMUNITY): Payer: Self-pay

## 2020-01-30 ENCOUNTER — Ambulatory Visit (HOSPITAL_COMMUNITY)
Admission: RE | Admit: 2020-01-30 | Discharge: 2020-01-30 | Disposition: A | Discharge status: Home / Self Care | Payer: Medicare Other | Source: Ambulatory Visit | Attending: Internal Medicine | Admitting: Internal Medicine

## 2020-01-30 DIAGNOSIS — M810 Age-related osteoporosis without current pathological fracture: Secondary | ICD-10-CM | POA: Diagnosis present

## 2020-01-30 MED ORDER — DENOSUMAB 60 MG/ML ~~LOC~~ SOSY
60.0000 mg | PREFILLED_SYRINGE | Freq: Once | SUBCUTANEOUS | Status: AC
  Administered 2020-01-30: 10:00:00 60 mg via SUBCUTANEOUS
  Filled 2020-01-30: qty 1

## 2020-06-27 ENCOUNTER — Other Ambulatory Visit: Payer: Self-pay | Admitting: Internal Medicine

## 2020-06-27 DIAGNOSIS — Z1231 Encounter for screening mammogram for malignant neoplasm of breast: Secondary | ICD-10-CM

## 2020-07-31 ENCOUNTER — Other Ambulatory Visit (HOSPITAL_COMMUNITY): Payer: Self-pay | Admitting: *Deleted

## 2020-08-01 ENCOUNTER — Ambulatory Visit (HOSPITAL_COMMUNITY)
Admission: RE | Admit: 2020-08-01 | Discharge: 2020-08-01 | Disposition: A | Discharge status: Home / Self Care | Payer: Medicare Other | Source: Ambulatory Visit | Attending: Internal Medicine | Admitting: Internal Medicine

## 2020-08-01 ENCOUNTER — Other Ambulatory Visit: Payer: Self-pay

## 2020-08-01 ENCOUNTER — Encounter (HOSPITAL_COMMUNITY): Payer: Medicare Other

## 2020-08-01 DIAGNOSIS — M810 Age-related osteoporosis without current pathological fracture: Secondary | ICD-10-CM | POA: Insufficient documentation

## 2020-08-01 MED ORDER — DENOSUMAB 60 MG/ML ~~LOC~~ SOSY
60.0000 mg | PREFILLED_SYRINGE | Freq: Once | SUBCUTANEOUS | Status: AC
  Administered 2020-08-01: 60 mg via SUBCUTANEOUS

## 2020-08-01 MED ORDER — DENOSUMAB 60 MG/ML ~~LOC~~ SOSY
PREFILLED_SYRINGE | SUBCUTANEOUS | Status: AC
  Filled 2020-08-01: qty 1

## 2020-08-09 ENCOUNTER — Other Ambulatory Visit: Payer: Self-pay

## 2020-08-09 ENCOUNTER — Ambulatory Visit
Admission: RE | Admit: 2020-08-09 | Discharge: 2020-08-09 | Disposition: A | Discharge status: Home / Self Care | Payer: Medicare Other | Source: Ambulatory Visit | Attending: Internal Medicine | Admitting: Internal Medicine

## 2020-08-09 DIAGNOSIS — Z1231 Encounter for screening mammogram for malignant neoplasm of breast: Secondary | ICD-10-CM

## 2020-08-13 ENCOUNTER — Other Ambulatory Visit: Payer: Self-pay | Admitting: Internal Medicine

## 2020-08-13 DIAGNOSIS — R928 Other abnormal and inconclusive findings on diagnostic imaging of breast: Secondary | ICD-10-CM

## 2020-08-16 ENCOUNTER — Other Ambulatory Visit: Payer: Self-pay | Admitting: Obstetrics and Gynecology

## 2020-08-19 ENCOUNTER — Other Ambulatory Visit: Payer: Self-pay

## 2020-08-19 ENCOUNTER — Ambulatory Visit
Admission: RE | Admit: 2020-08-19 | Discharge: 2020-08-19 | Disposition: A | Discharge status: Home / Self Care | Payer: Medicare Other | Source: Ambulatory Visit | Attending: Internal Medicine | Admitting: Internal Medicine

## 2020-08-19 ENCOUNTER — Ambulatory Visit: Payer: Medicare Other

## 2020-08-19 DIAGNOSIS — R928 Other abnormal and inconclusive findings on diagnostic imaging of breast: Secondary | ICD-10-CM

## 2021-02-07 ENCOUNTER — Other Ambulatory Visit (HOSPITAL_COMMUNITY): Payer: Self-pay | Admitting: *Deleted

## 2021-02-10 ENCOUNTER — Ambulatory Visit (HOSPITAL_COMMUNITY)
Admission: RE | Admit: 2021-02-10 | Discharge: 2021-02-10 | Disposition: A | Discharge status: Home / Self Care | Payer: Medicare Other | Source: Ambulatory Visit | Attending: Internal Medicine | Admitting: Internal Medicine

## 2021-02-10 ENCOUNTER — Other Ambulatory Visit: Payer: Self-pay

## 2021-02-10 DIAGNOSIS — M810 Age-related osteoporosis without current pathological fracture: Secondary | ICD-10-CM | POA: Diagnosis present

## 2021-02-10 MED ORDER — DENOSUMAB 60 MG/ML ~~LOC~~ SOSY
PREFILLED_SYRINGE | SUBCUTANEOUS | Status: AC
  Filled 2021-02-10: qty 1

## 2021-02-10 MED ORDER — DENOSUMAB 60 MG/ML ~~LOC~~ SOSY
60.0000 mg | PREFILLED_SYRINGE | Freq: Once | SUBCUTANEOUS | Status: AC
  Administered 2021-02-10: 60 mg via SUBCUTANEOUS

## 2021-07-14 ENCOUNTER — Other Ambulatory Visit: Payer: Self-pay | Admitting: Internal Medicine

## 2021-07-14 DIAGNOSIS — Z1231 Encounter for screening mammogram for malignant neoplasm of breast: Secondary | ICD-10-CM

## 2021-08-22 ENCOUNTER — Ambulatory Visit
Admission: RE | Admit: 2021-08-22 | Discharge: 2021-08-22 | Disposition: A | Discharge status: Home / Self Care | Payer: Medicare Other | Source: Ambulatory Visit | Attending: Internal Medicine | Admitting: Internal Medicine

## 2021-08-22 ENCOUNTER — Other Ambulatory Visit: Payer: Self-pay

## 2021-08-22 ENCOUNTER — Other Ambulatory Visit (HOSPITAL_COMMUNITY): Payer: Self-pay | Admitting: *Deleted

## 2021-08-22 DIAGNOSIS — Z1231 Encounter for screening mammogram for malignant neoplasm of breast: Secondary | ICD-10-CM

## 2021-08-25 ENCOUNTER — Other Ambulatory Visit: Payer: Self-pay

## 2021-08-25 ENCOUNTER — Ambulatory Visit (HOSPITAL_COMMUNITY)
Admission: RE | Admit: 2021-08-25 | Discharge: 2021-08-25 | Disposition: A | Discharge status: Home / Self Care | Payer: Medicare Other | Source: Ambulatory Visit | Attending: Internal Medicine | Admitting: Internal Medicine

## 2021-08-25 DIAGNOSIS — M810 Age-related osteoporosis without current pathological fracture: Secondary | ICD-10-CM | POA: Insufficient documentation

## 2021-08-25 MED ORDER — DENOSUMAB 60 MG/ML ~~LOC~~ SOSY: 60.0000 mg | PREFILLED_SYRINGE | Freq: Once | SUBCUTANEOUS | Status: AC

## 2021-08-25 MED ORDER — DENOSUMAB 60 MG/ML ~~LOC~~ SOSY
PREFILLED_SYRINGE | SUBCUTANEOUS | Status: AC
  Administered 2021-08-25: 60 mg via SUBCUTANEOUS
  Filled 2021-08-25: qty 1

## 2021-12-25 IMAGING — MG MM DIGITAL SCREENING BILAT W/ TOMO AND CAD
6 of 10 series · 6 of 30 positions shown · non-contrast
Comparison: Previous exam(s).

CLINICAL DATA: Screening.

EXAM:
DIGITAL SCREENING BILATERAL MAMMOGRAM WITH TOMOSYNTHESIS AND CAD
TECHNIQUE: Bilateral screening digital craniocaudal and mediolateral oblique
mammograms were obtained. Bilateral screening digital breast
tomosynthesis was performed. The images were evaluated with
computer-aided detection.

[L CC synth-2D]
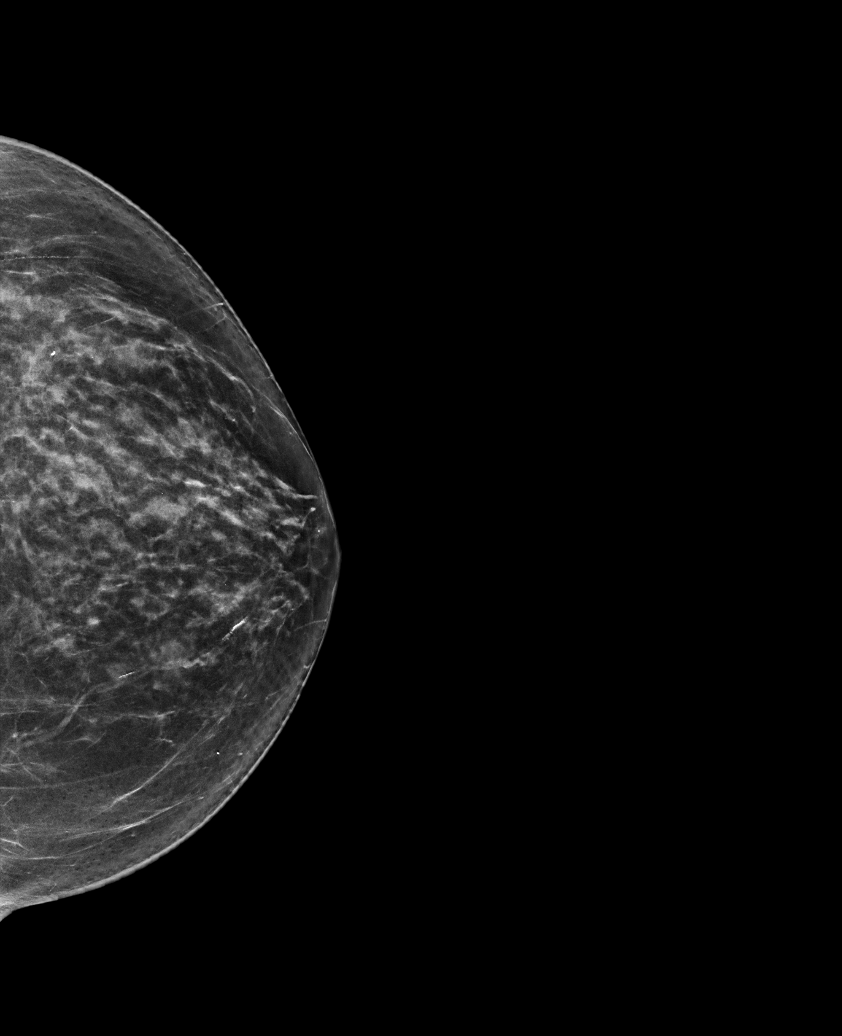

[R CC synth-2D]
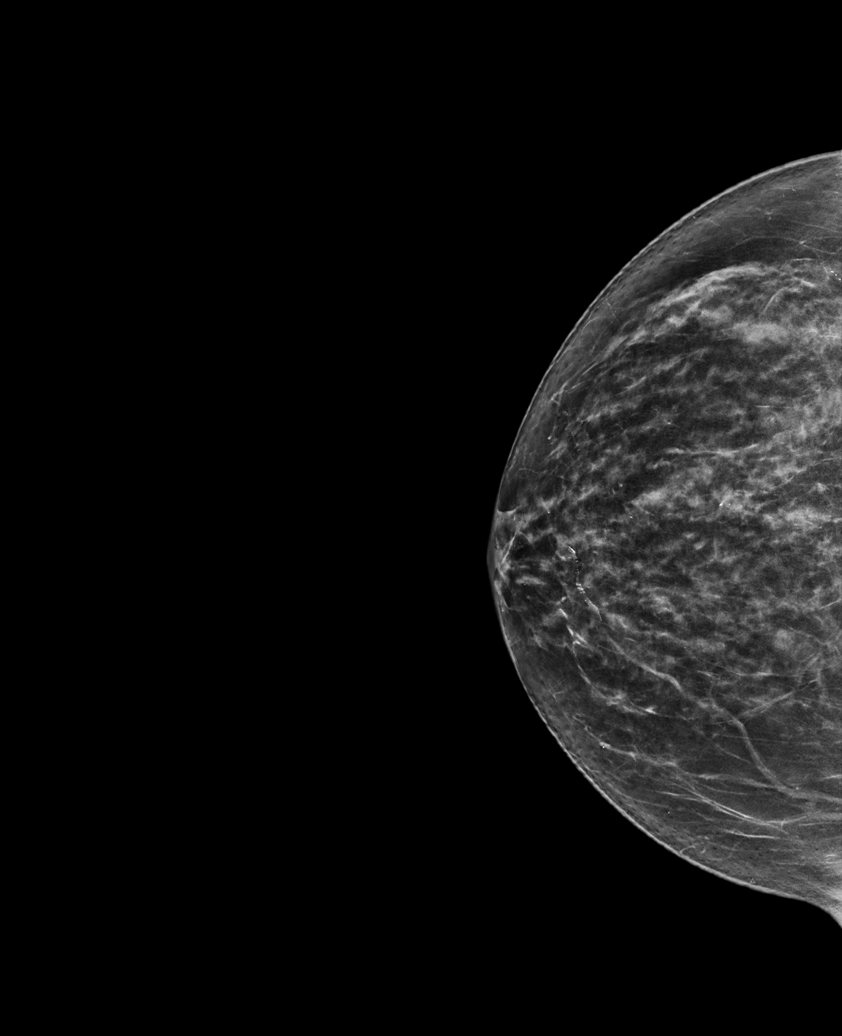

[R MLO synth-2D]
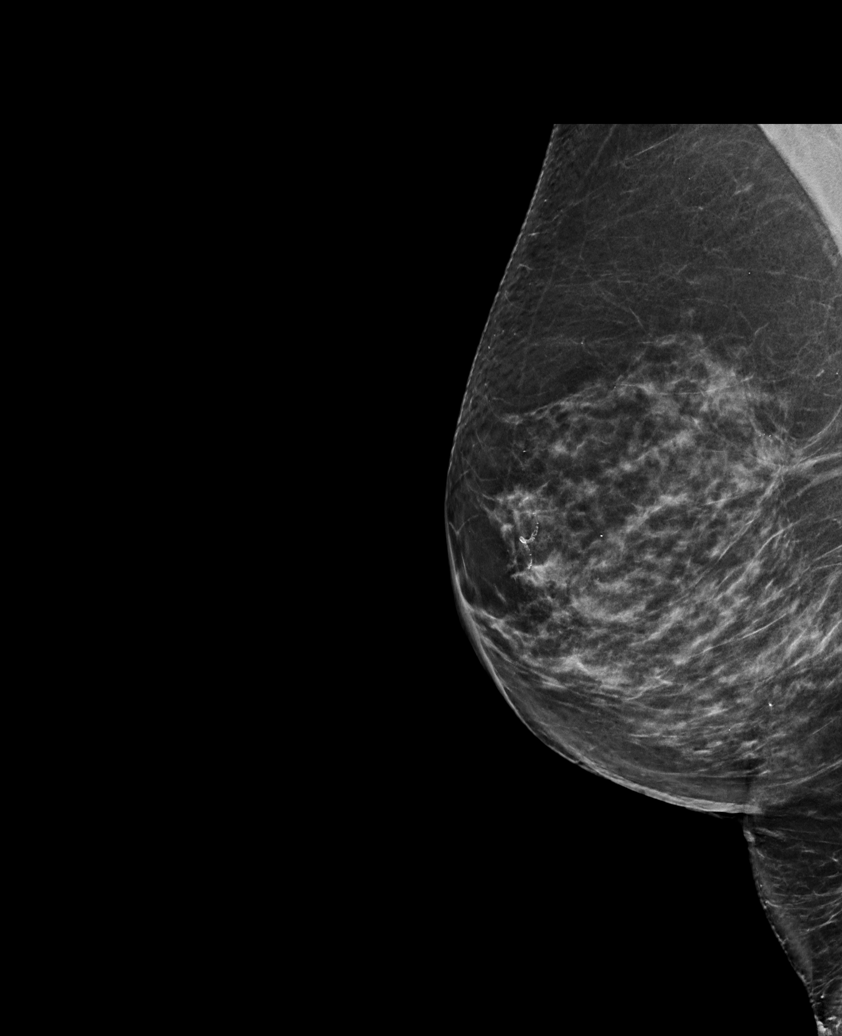

[L MLO synth-2D (1 of 2)]
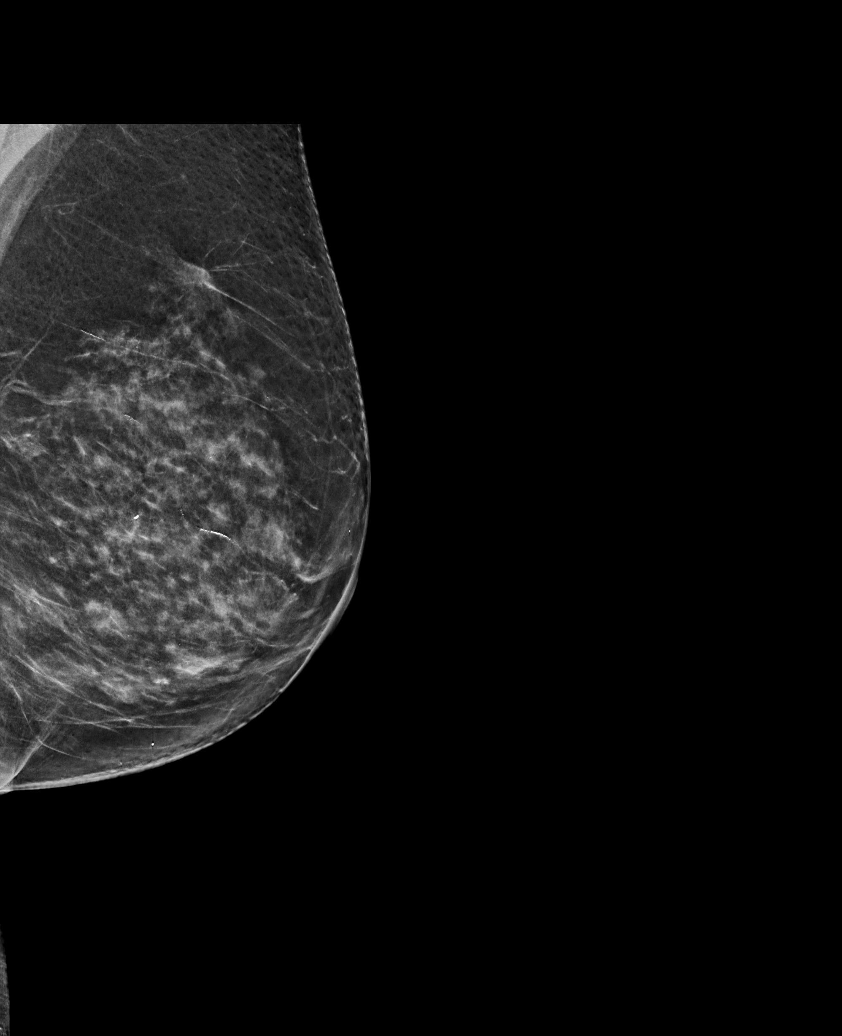

[L MLO synth-2D (2 of 2)]
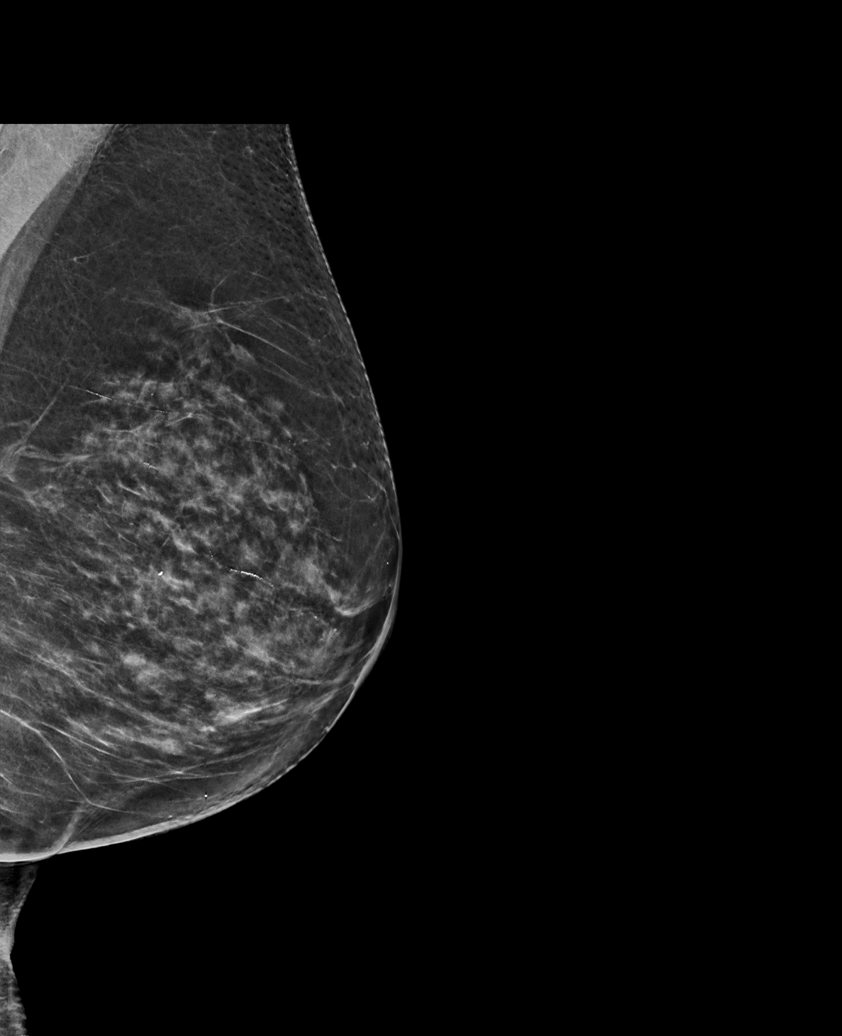

[R MLO tomo · tomo slice 37/72.0]
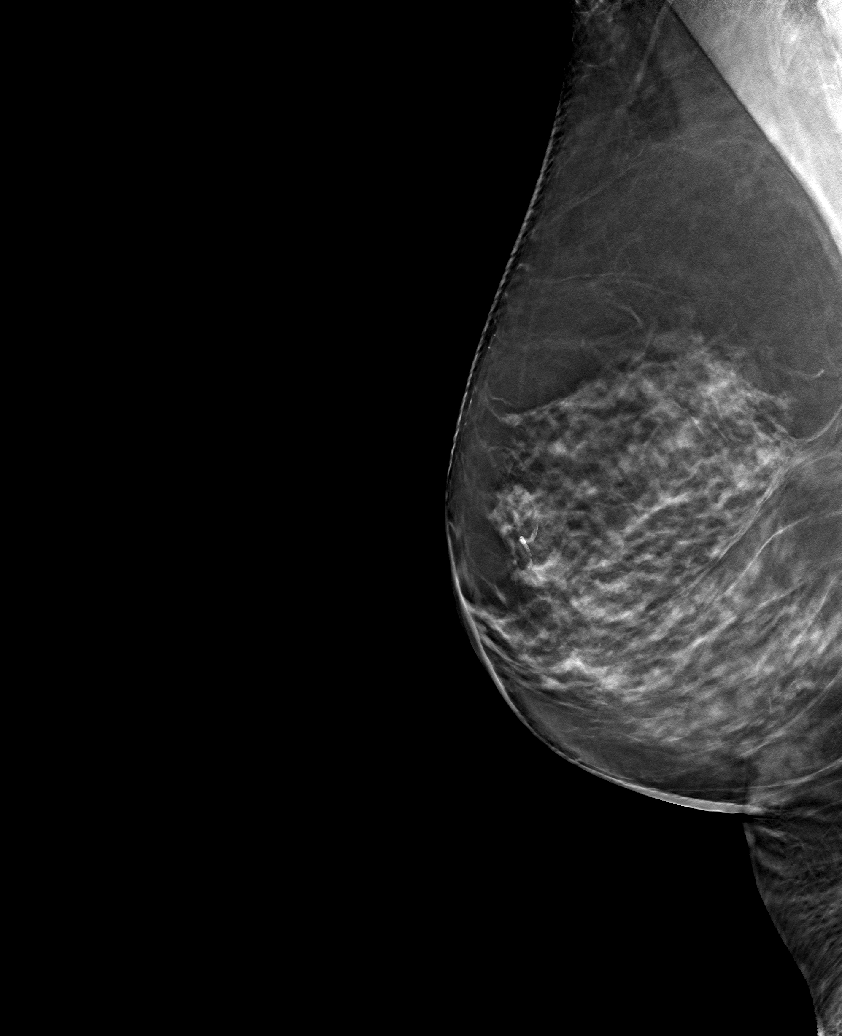

[6 of 30 positions shown; findings below may reference images not displayed]

ACR Breast Density Category c: The breast tissue is heterogeneously
dense, which may obscure small masses.
FINDINGS: There are no findings suspicious for malignancy.
IMPRESSION: No mammographic evidence of malignancy. A result letter of this
screening mammogram will be mailed directly to the patient.

RECOMMENDATION:
Screening mammogram in one year. (Code:[id number])

BI-RADS CATEGORY  1: Negative.

## 2022-02-27 ENCOUNTER — Other Ambulatory Visit (HOSPITAL_COMMUNITY): Payer: Self-pay | Admitting: *Deleted

## 2022-03-02 ENCOUNTER — Ambulatory Visit (HOSPITAL_COMMUNITY)
Admission: RE | Admit: 2022-03-02 | Discharge: 2022-03-02 | Disposition: A | Discharge status: Home / Self Care | Payer: Medicare Other | Source: Ambulatory Visit | Attending: Internal Medicine | Admitting: Internal Medicine

## 2022-03-02 DIAGNOSIS — M810 Age-related osteoporosis without current pathological fracture: Secondary | ICD-10-CM | POA: Insufficient documentation

## 2022-03-02 MED ORDER — DENOSUMAB 60 MG/ML ~~LOC~~ SOSY
60.0000 mg | PREFILLED_SYRINGE | Freq: Once | SUBCUTANEOUS | Status: AC
  Administered 2022-03-02: 60 mg via SUBCUTANEOUS

## 2022-03-02 MED ORDER — DENOSUMAB 60 MG/ML ~~LOC~~ SOSY
PREFILLED_SYRINGE | SUBCUTANEOUS | Status: AC
  Filled 2022-03-02: qty 1

## 2022-07-14 ENCOUNTER — Other Ambulatory Visit: Payer: Self-pay | Admitting: Internal Medicine

## 2022-07-14 DIAGNOSIS — Z1231 Encounter for screening mammogram for malignant neoplasm of breast: Secondary | ICD-10-CM

## 2022-08-24 ENCOUNTER — Ambulatory Visit: Payer: Medicare Other

## 2022-09-01 ENCOUNTER — Other Ambulatory Visit (HOSPITAL_COMMUNITY): Payer: Self-pay | Admitting: *Deleted

## 2022-09-02 ENCOUNTER — Ambulatory Visit (HOSPITAL_COMMUNITY)
Admission: RE | Admit: 2022-09-02 | Discharge: 2022-09-02 | Disposition: A | Discharge status: Home / Self Care | Payer: Medicare Other | Source: Ambulatory Visit | Attending: Internal Medicine | Admitting: Internal Medicine

## 2022-09-02 DIAGNOSIS — M810 Age-related osteoporosis without current pathological fracture: Secondary | ICD-10-CM | POA: Insufficient documentation

## 2022-09-02 MED ORDER — DENOSUMAB 60 MG/ML ~~LOC~~ SOSY
PREFILLED_SYRINGE | SUBCUTANEOUS | Status: AC
  Filled 2022-09-02: qty 1

## 2022-09-02 MED ORDER — DENOSUMAB 60 MG/ML ~~LOC~~ SOSY
60.0000 mg | PREFILLED_SYRINGE | Freq: Once | SUBCUTANEOUS | Status: AC
  Administered 2022-09-02: 60 mg via SUBCUTANEOUS

## 2022-09-17 ENCOUNTER — Ambulatory Visit
Admission: RE | Admit: 2022-09-17 | Discharge: 2022-09-17 | Disposition: A | Discharge status: Home / Self Care | Payer: Medicare Other | Source: Ambulatory Visit | Attending: Internal Medicine | Admitting: Internal Medicine

## 2022-09-17 DIAGNOSIS — Z1231 Encounter for screening mammogram for malignant neoplasm of breast: Secondary | ICD-10-CM

## 2023-08-06 ENCOUNTER — Other Ambulatory Visit: Payer: Self-pay | Admitting: Internal Medicine

## 2023-08-06 DIAGNOSIS — Z1231 Encounter for screening mammogram for malignant neoplasm of breast: Secondary | ICD-10-CM

## 2023-09-20 ENCOUNTER — Ambulatory Visit
Admission: RE | Admit: 2023-09-20 | Discharge: 2023-09-20 | Disposition: A | Discharge status: Home / Self Care | Payer: Medicare Other | Source: Ambulatory Visit | Attending: Internal Medicine | Admitting: Internal Medicine

## 2023-09-20 DIAGNOSIS — Z1231 Encounter for screening mammogram for malignant neoplasm of breast: Secondary | ICD-10-CM

## 2023-09-23 ENCOUNTER — Other Ambulatory Visit: Payer: Self-pay | Admitting: Internal Medicine

## 2023-09-23 DIAGNOSIS — R928 Other abnormal and inconclusive findings on diagnostic imaging of breast: Secondary | ICD-10-CM

## 2023-10-04 ENCOUNTER — Ambulatory Visit: Payer: Medicare Other

## 2023-10-04 ENCOUNTER — Ambulatory Visit
Admission: RE | Admit: 2023-10-04 | Discharge: 2023-10-04 | Disposition: A | Discharge status: Home / Self Care | Payer: Medicare Other | Source: Ambulatory Visit | Attending: Internal Medicine | Admitting: Internal Medicine

## 2023-10-04 DIAGNOSIS — R928 Other abnormal and inconclusive findings on diagnostic imaging of breast: Secondary | ICD-10-CM

## 2024-08-11 ENCOUNTER — Other Ambulatory Visit: Payer: Self-pay | Admitting: Internal Medicine

## 2024-08-11 DIAGNOSIS — Z0000 Encounter for general adult medical examination without abnormal findings: Secondary | ICD-10-CM

## 2024-10-04 ENCOUNTER — Ambulatory Visit
Admission: RE | Admit: 2024-10-04 | Discharge: 2024-10-04 | Disposition: A | Source: Ambulatory Visit | Attending: Internal Medicine | Admitting: Internal Medicine

## 2024-10-04 DIAGNOSIS — Z0000 Encounter for general adult medical examination without abnormal findings: Secondary | ICD-10-CM
